# Patient Record
Sex: Male | Born: 1975 | Race: Black or African American | Hispanic: No | Marital: Single | Smoking: Current every day smoker
Health system: Southern US, Community
[De-identification: ages and names within clinical notes are randomized; demographics above are authoritative.]

## PROBLEM LIST (undated history)

## (undated) DIAGNOSIS — I10 Essential (primary) hypertension: Secondary | ICD-10-CM

---

## 2006-04-23 ENCOUNTER — Emergency Department (HOSPITAL_COMMUNITY): Admission: EM | Admit: 2006-04-23 | Discharge: 2006-04-24 | Payer: Self-pay | Admitting: Emergency Medicine

## 2007-08-08 ENCOUNTER — Emergency Department (HOSPITAL_COMMUNITY): Admission: EM | Admit: 2007-08-08 | Discharge: 2007-08-08 | Payer: Self-pay | Admitting: Emergency Medicine

## 2007-08-12 ENCOUNTER — Emergency Department (HOSPITAL_COMMUNITY): Admission: EM | Admit: 2007-08-12 | Discharge: 2007-08-12 | Payer: Self-pay | Admitting: Emergency Medicine

## 2008-01-29 ENCOUNTER — Emergency Department (HOSPITAL_COMMUNITY): Admission: EM | Admit: 2008-01-29 | Discharge: 2008-01-29 | Payer: Self-pay | Admitting: Emergency Medicine

## 2009-01-26 ENCOUNTER — Emergency Department (HOSPITAL_COMMUNITY): Admission: EM | Admit: 2009-01-26 | Discharge: 2009-01-26 | Payer: Self-pay | Admitting: Emergency Medicine

## 2009-04-21 ENCOUNTER — Emergency Department (HOSPITAL_COMMUNITY): Admission: EM | Admit: 2009-04-21 | Discharge: 2009-04-21 | Payer: Self-pay | Admitting: Emergency Medicine

## 2009-04-25 ENCOUNTER — Emergency Department (HOSPITAL_COMMUNITY): Admission: EM | Admit: 2009-04-25 | Discharge: 2009-04-25 | Payer: Self-pay | Admitting: Emergency Medicine

## 2010-09-23 IMAGING — CR DG HAND 3V BILAT
6 series · 6 of 6 positions shown · non-contrast
Comparison: None

CLINICAL DATA: Fell.  Pain.  Lacerations.

BILATERAL HAND - 3 VIEW

[x hand pa left]
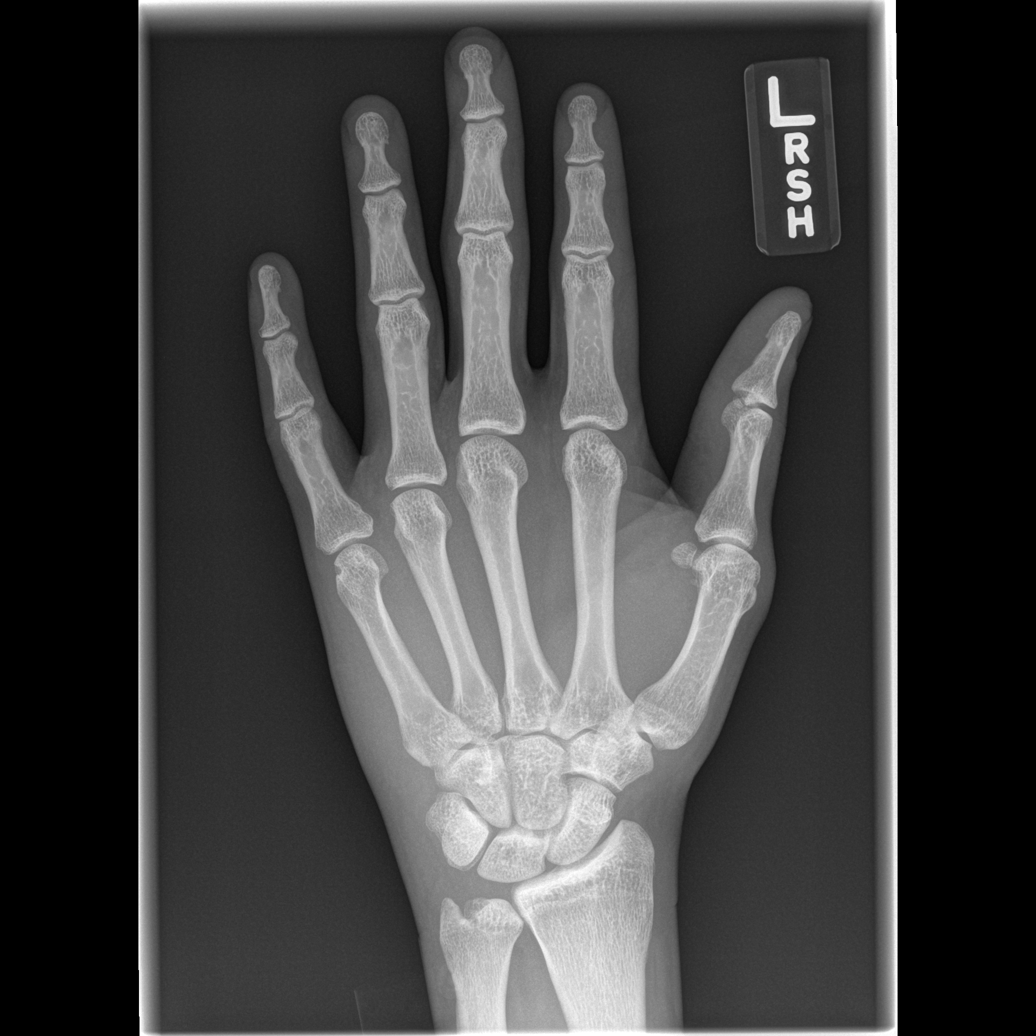

[x hand oblique left]
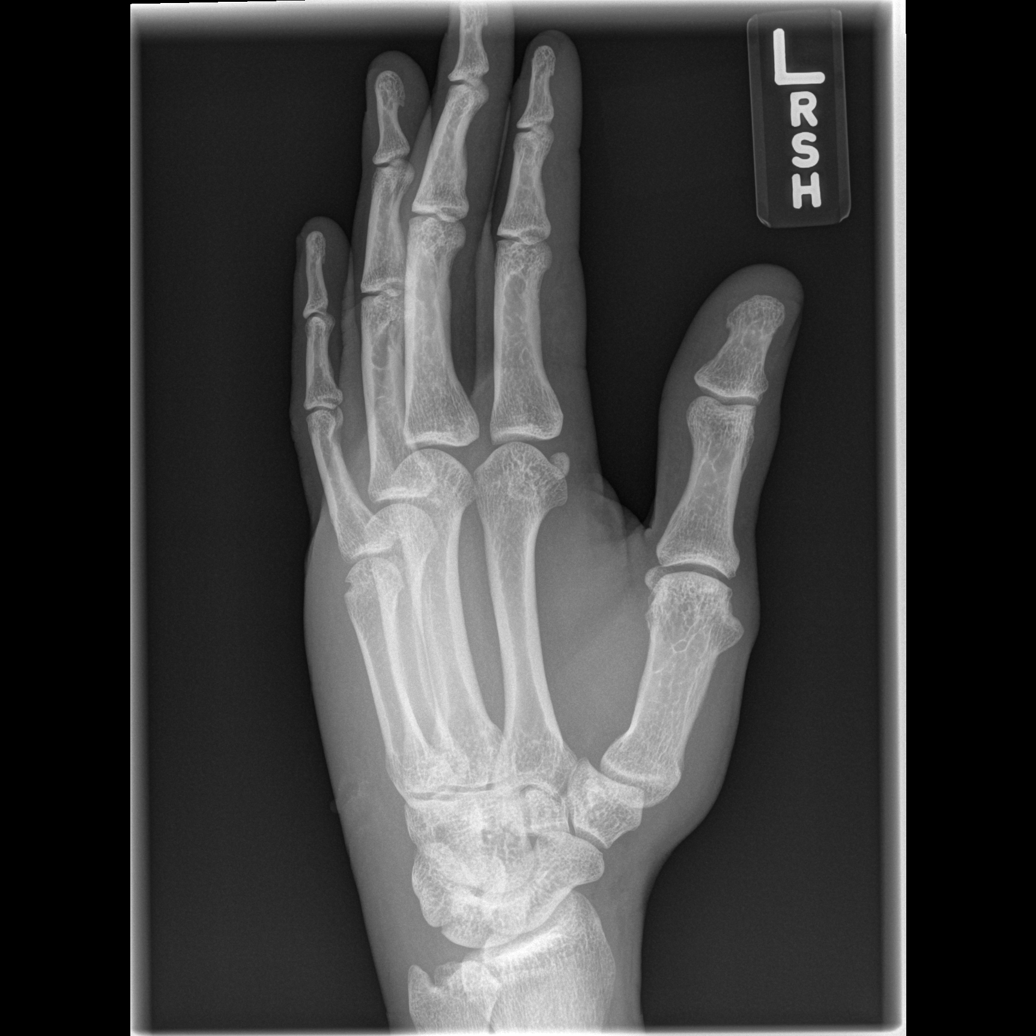

[x hand lat left]
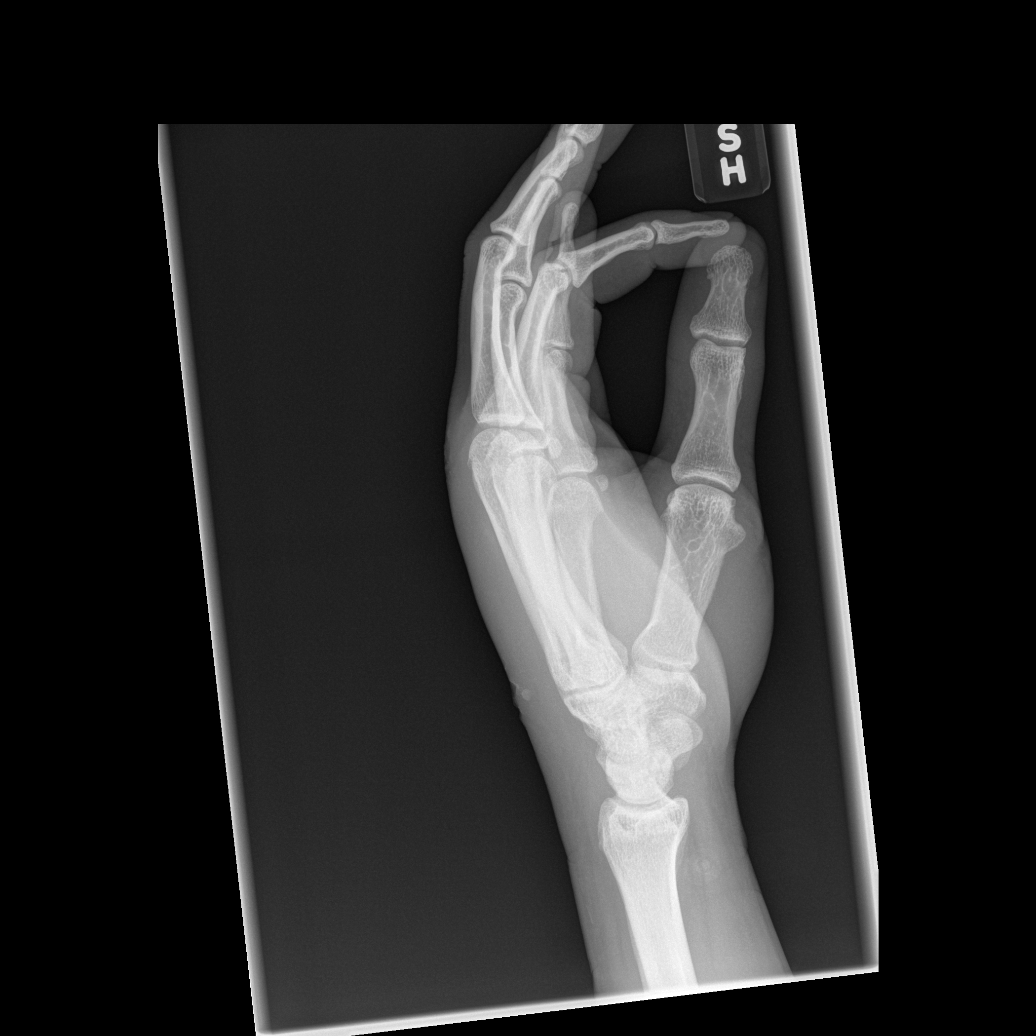

[x hand pa right]
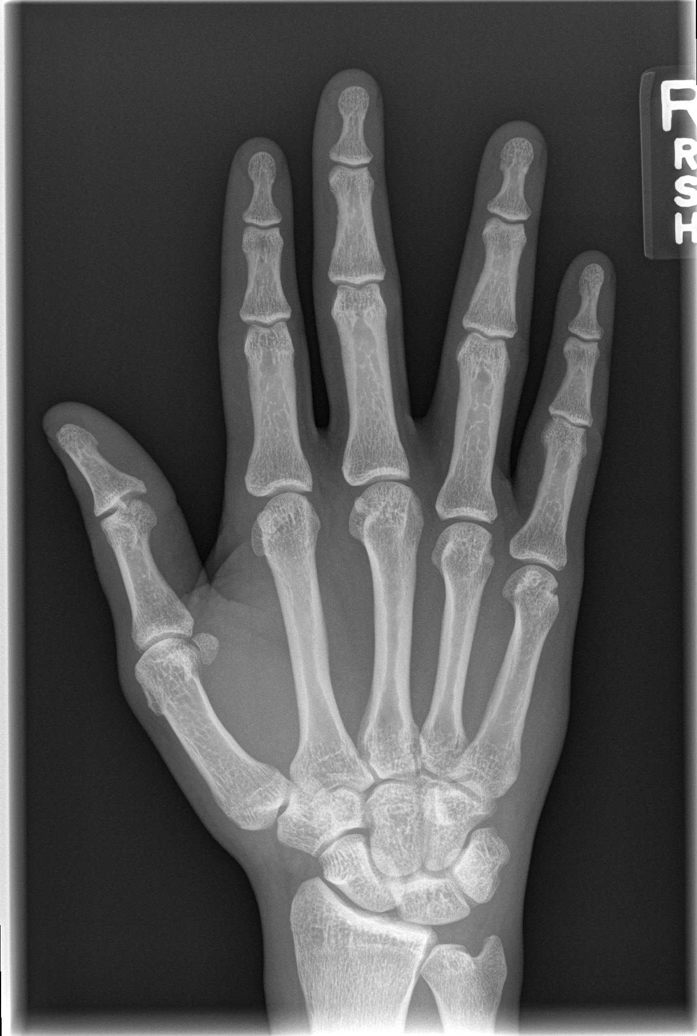

[x hand oblique right]
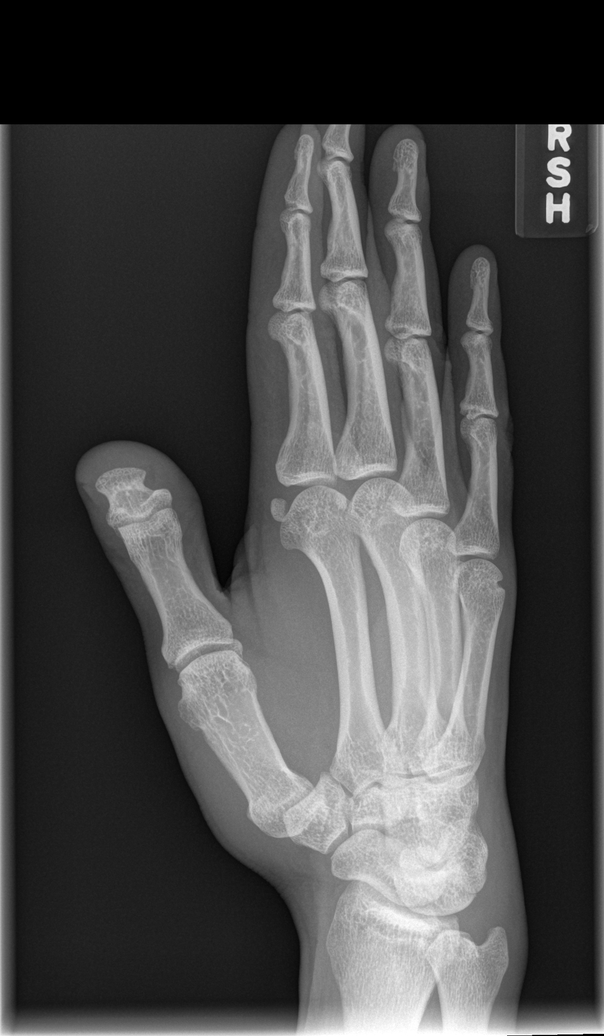

[x hand lat right]
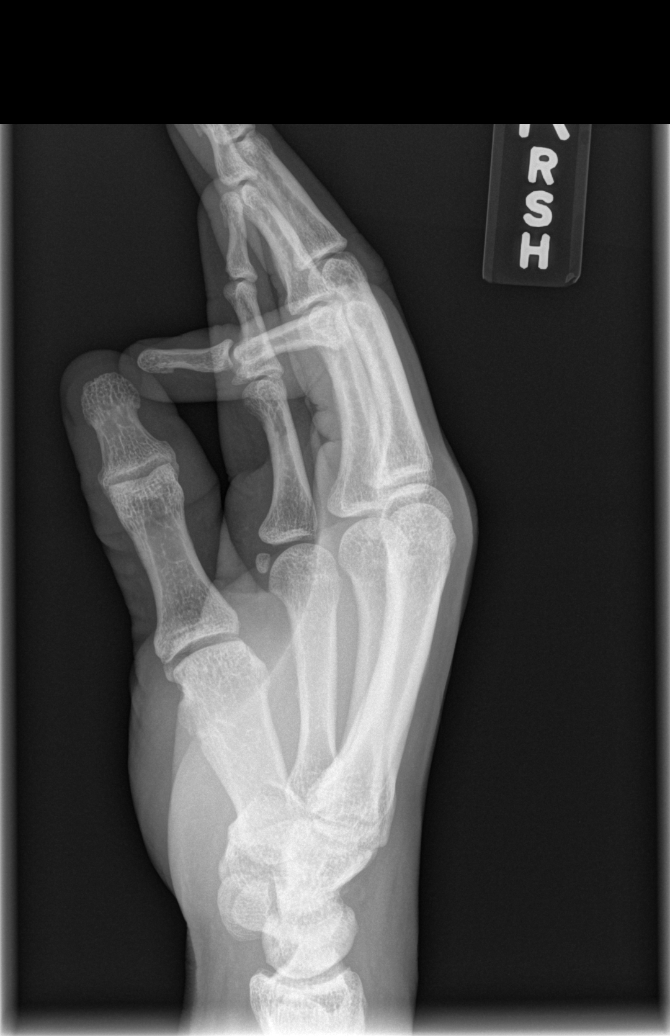

[6 of 6 positions shown; findings below may reference images not displayed]

FINDINGS: Three views of the left hand show a soft tissue
laceration dorsally.  There could be some tiny fleck-like foreign
objects in the region of the laceration.  Elsewhere, the
examination is unremarkable without fracture or dislocation.

On the right, there is no evidence of fracture or dislocation.  No
sign of foreign object.
IMPRESSION: No fracture or dislocation on either side.  Soft tissue laceration
dorsally on the left, possibly with some fleck-like foreign
objects.

## 2011-08-16 LAB — CULTURE, ROUTINE-ABSCESS

## 2020-10-16 ENCOUNTER — Ambulatory Visit (INDEPENDENT_AMBULATORY_CARE_PROVIDER_SITE_OTHER): Payer: Self-pay

## 2020-10-16 ENCOUNTER — Ambulatory Visit (HOSPITAL_COMMUNITY)
Admission: EM | Admit: 2020-10-16 | Discharge: 2020-10-16 | Disposition: A | Discharge status: Home / Self Care | Payer: Self-pay | Attending: Internal Medicine | Admitting: Internal Medicine

## 2020-10-16 ENCOUNTER — Other Ambulatory Visit: Payer: Self-pay

## 2020-10-16 ENCOUNTER — Encounter (HOSPITAL_COMMUNITY): Payer: Self-pay | Admitting: *Deleted

## 2020-10-16 DIAGNOSIS — R2241 Localized swelling, mass and lump, right lower limb: Secondary | ICD-10-CM

## 2020-10-16 DIAGNOSIS — M79674 Pain in right toe(s): Secondary | ICD-10-CM

## 2020-10-16 HISTORY — DX: Essential (primary) hypertension: I10

## 2020-10-16 LAB — URIC ACID: Uric Acid, Serum: 6.9 mg/dL (ref 3.7–8.6)

## 2020-10-16 MED ORDER — PREDNISONE 50 MG PO TABS: ORAL_TABLET | ORAL | 0 refills | Status: AC

## 2020-10-16 NOTE — ED Triage Notes (Signed)
Pt reports right great toe pain x 3 months; believes he either stubbed toe or "bent it too far" while working on a car.  C/O intermittent pain and swelling.  Denies hx of gout.

## 2020-10-16 NOTE — Discharge Instructions (Signed)
Take steroid as prescribed. You need to call the referral number to establish a primary care dr and be seen. You will likely need blood pressure treatment. Please return or go to ER should you develop any fever, chills, chest pain or shortness of breath.

## 2020-10-16 NOTE — ED Provider Notes (Signed)
[facility]    CSN: [id number] Arrival date & time: 10/16/20  1012      History   Chief Complaint Chief Complaint  Patient presents with  . Toe Pain    HPI Matthew Patton is a 44 y.o. male with past medical history of hypertension.  Patient states he was on medication while in jail however states they took him off as his blood pressure was low.  Reports swelling to right great toe and pain x3 months.  Patient states he may have injured toe by "bending it" while working on his car though he is uncertain of specifics.  Patient states swelling is constant though pain only intermittent and "throbbing" in nature.  Patient denies any recent fever or chills, no erythema, no numbness or tingling.  Patient denies any recent headache, dizziness, chest pain, shortness of breath, lower extremity swelling.   Past Medical History:  Diagnosis Date  . Hypertension     There are no problems to display for this patient.   History reviewed. No pertinent surgical history.     Home Medications    Prior to Admission medications   Medication Sig Start Date End Date Taking? Authorizing Provider  predniSONE (DELTASONE) 50 MG tablet One tab once daily 10/16/20   Rolla Etienne, NP    Family History History reviewed. No pertinent family history.  Social History Social History   Tobacco Use  . Smoking status: Current Every Day Smoker  . Smokeless tobacco: Never Used  Vaping Use  . Vaping Use: Some days  Substance Use Topics  . Alcohol use: Yes    Comment: occasionally  . Drug use: Not Currently     Allergies   Patient has no known allergies.   Review of Systems As stated in HPI otherwise negative   Physical Exam Triage Vital Signs ED Triage Vitals  Enc Vitals Group     BP 10/16/20 1051 (!) 160/122     Pulse Rate 10/16/20 1051 76     Resp 10/16/20 1051 16     Temp --      Temp src --      SpO2 10/16/20 1051 98 %     Weight --      Height --      Head  Circumference --      Peak Flow --      Pain Score 10/16/20 1054 7     Pain Loc --      Pain Edu? --      Excl. in GC? --    No data found.  Updated Vital Signs BP (!) 156/112 Comment: states used to be on HTN meds "few yrs ago", but it go D/C'd when in jail  Pulse 76   Resp 16   SpO2 98%       Physical Exam Constitutional:      General: He is not in acute distress.    Appearance: Normal appearance. He is not ill-appearing.  Cardiovascular:     Rate and Rhythm: Normal rate and regular rhythm.  Pulmonary:     Effort: Pulmonary effort is normal.     Breath sounds: Normal breath sounds.  Musculoskeletal:        General: Swelling present. No deformity. Normal range of motion.     Comments: Swelling to right great toe.  No erythema or warmth.  Mild tenderness upon palpation on distal metatarsal.  No obvious deformity.  2+ DP pulse  Skin:    General: Skin  is warm and dry.  Neurological:     Mental Status: He is alert.      UC Treatments / Results  Labs (all labs ordered are listed, but only abnormal results are displayed) Labs Reviewed  URIC ACID  RHEUMATOID FACTOR    EKG   Radiology DG Toe Great Right  Result Date: 10/16/2020 CLINICAL DATA:  Right great toe pain and swelling. EXAM: RIGHT GREAT TOE COMPARISON:  None. FINDINGS: There is no evidence of fracture or dislocation. There is no evidence of arthropathy or other focal bone abnormality. Mild diffuse soft tissue swelling is noted concerning for edema or inflammation. IMPRESSION: No fracture or dislocation is noted. Mild diffuse soft tissue swelling is noted concerning for edema or inflammation. Electronically Signed   By: Lupita Raider M.D.   On: 10/16/2020 11:56    Procedures Procedures (including critical care time)  Medications Ordered in UC Medications - No data to display  Initial Impression / Assessment and Plan / UC Course  I have reviewed the triage vital signs and the nursing notes.  Pertinent  labs & imaging results that were available during my care of the patient were reviewed by me and considered in my medical decision making (see chart for details).  Great toe pain, right -Unclear etiology.  X-ray negative for fracture.  Consider sprain with questionable injury months ago -No evidence of infection and not especially tender to suggest gouty flare -Check uric acid, rheumatoid factor -Short steroid burst to determine if this alleviates symptoms  Hypertension -Patient states he was on medication while in jail recently though stopped because his BP became too low. -Denies any headache, dizziness, chest pain, shortness of breath -Portance of follow-up explained to patient with referrals given -Patient verifies understanding  Reviewed expections re: course of current medical issues. Questions answered. Outlined signs and symptoms indicating need for more acute intervention. Pt verbalized understanding. AVS given   Final Clinical Impressions(s) / UC Diagnoses   Final diagnoses:  Pain of right great toe     Discharge Instructions     Take steroid as prescribed. You need to call the referral number to establish a primary care dr and be seen. You will likely need blood pressure treatment. Please return or go to ER should you develop any fever, chills, chest pain or shortness of breath.     ED Prescriptions    Medication Sig Dispense Auth. Provider   predniSONE (DELTASONE) 50 MG tablet One tab once daily 7 tablet Rolla Etienne, NP     PDMP not reviewed this encounter.   Rolla Etienne, NP 10/16/20 1659

## 2020-10-18 LAB — RHEUMATOID FACTOR: Rhuematoid fact SerPl-aCnc: <10 IU/mL (ref <14.0)

## 2020-11-15 ENCOUNTER — Inpatient Hospital Stay (INDEPENDENT_AMBULATORY_CARE_PROVIDER_SITE_OTHER): Payer: Self-pay | Admitting: Primary Care

## 2021-01-09 ENCOUNTER — Ambulatory Visit: Payer: Self-pay | Admitting: Nurse Practitioner

## 2022-03-20 IMAGING — DX DG TOE GREAT 2+V*R*
3 series · 3 of 3 positions shown · non-contrast
Comparison: None.

CLINICAL DATA: Right great toe pain and swelling.

EXAM:
RIGHT GREAT TOE

[toe ap]
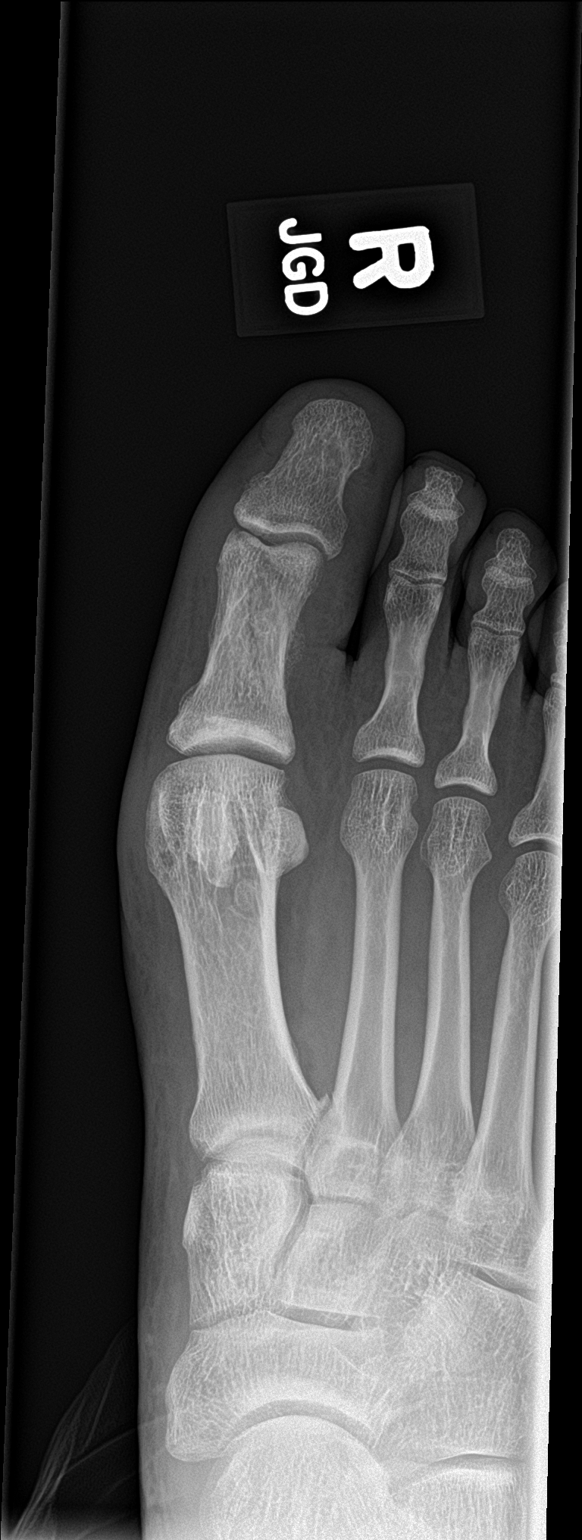

[toe obl]
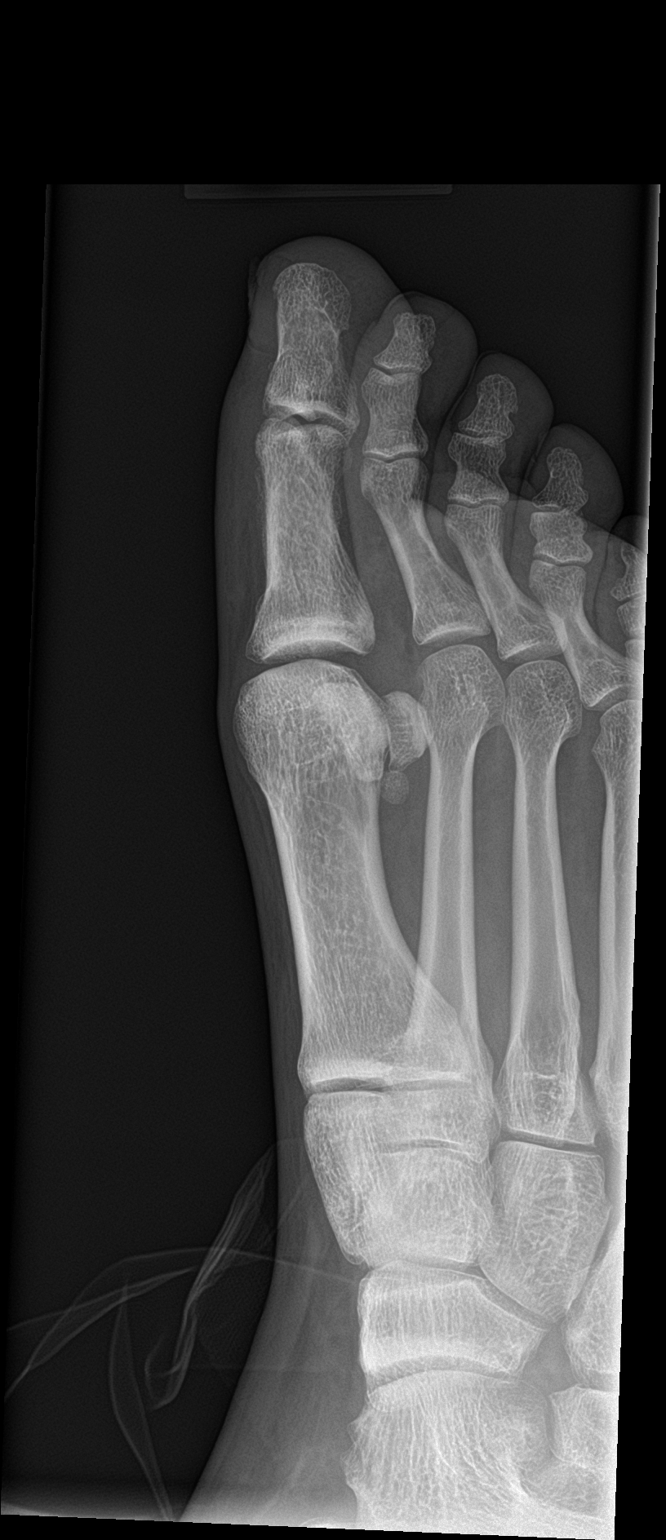

[toe lat]
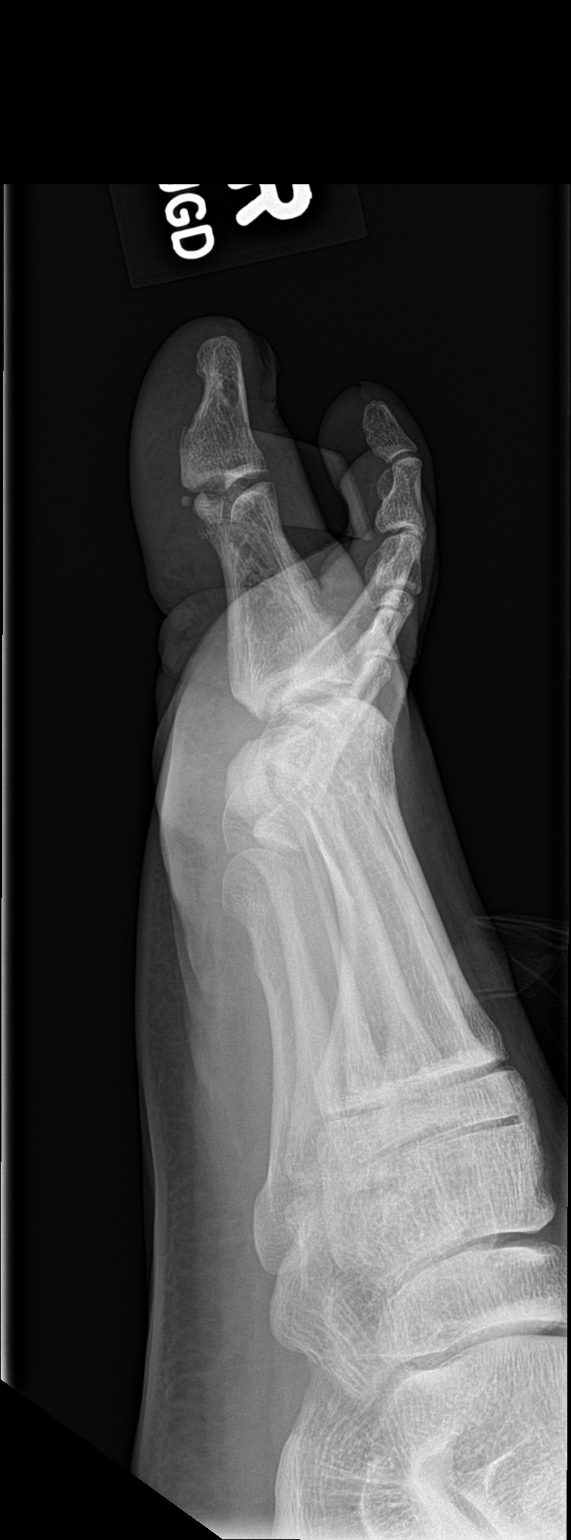

[3 of 3 positions shown; findings below may reference images not displayed]

FINDINGS: There is no evidence of fracture or dislocation. There is no
evidence of arthropathy or other focal bone abnormality. Mild
diffuse soft tissue swelling is noted concerning for edema or
inflammation.
IMPRESSION: No fracture or dislocation is noted. Mild diffuse soft tissue
swelling is noted concerning for edema or inflammation.

## 2024-03-06 ENCOUNTER — Other Ambulatory Visit: Payer: Self-pay

## 2024-03-06 ENCOUNTER — Emergency Department (HOSPITAL_COMMUNITY): Payer: Self-pay

## 2024-03-06 ENCOUNTER — Encounter (HOSPITAL_COMMUNITY): Payer: Self-pay

## 2024-03-06 ENCOUNTER — Emergency Department (HOSPITAL_COMMUNITY)
Admission: EM | Admit: 2024-03-06 | Discharge: 2024-03-06 | Disposition: A | Discharge status: Home / Self Care | Payer: Self-pay | Attending: Emergency Medicine | Admitting: Emergency Medicine

## 2024-03-06 DIAGNOSIS — R55 Syncope and collapse: Secondary | ICD-10-CM | POA: Diagnosis present

## 2024-03-06 DIAGNOSIS — I10 Essential (primary) hypertension: Secondary | ICD-10-CM | POA: Insufficient documentation

## 2024-03-06 DIAGNOSIS — E876 Hypokalemia: Secondary | ICD-10-CM | POA: Diagnosis not present

## 2024-03-06 LAB — CBC WITH DIFFERENTIAL/PLATELET
Abs Immature Granulocytes: 0.04 10*3/uL (ref 0.00–0.07)
Basophils Absolute: 0 10*3/uL (ref 0.0–0.1)
Basophils Relative: 1 %
Eosinophils Absolute: 0.1 10*3/uL (ref 0.0–0.5)
Eosinophils Relative: 1 %
HCT: 40.2 % (ref 39.0–52.0)
Hemoglobin: 13.3 g/dL (ref 13.0–17.0)
Immature Granulocytes: 1 %
Lymphocytes Relative: 29 %
Lymphs Abs: 1.6 10*3/uL (ref 0.7–4.0)
MCH: 28.5 pg (ref 26.0–34.0)
MCHC: 33.1 g/dL (ref 30.0–36.0)
MCV: 86.1 fL (ref 80.0–100.0)
Monocytes Absolute: 0.6 10*3/uL (ref 0.1–1.0)
Monocytes Relative: 10 %
Neutro Abs: 3.3 10*3/uL (ref 1.7–7.7)
Neutrophils Relative %: 58 %
Platelets: 273 10*3/uL (ref 150–400)
RBC: 4.67 MIL/uL (ref 4.22–5.81)
RDW: 13.5 % (ref 11.5–15.5)
WBC: 5.6 10*3/uL (ref 4.0–10.5)
nRBC: 0 % (ref 0.0–0.2)

## 2024-03-06 LAB — COMPREHENSIVE METABOLIC PANEL WITH GFR
ALT: 65 U/L — ABNORMAL HIGH (ref 0–44)
AST: 36 U/L (ref 15–41)
Albumin: 3.3 g/dL — ABNORMAL LOW (ref 3.5–5.0)
Alkaline Phosphatase: 41 U/L (ref 38–126)
Anion gap: 11 (ref 5–15)
BUN: 11 mg/dL (ref 6–20)
CO2: 25 mmol/L (ref 22–32)
Calcium: 8.9 mg/dL (ref 8.9–10.3)
Chloride: 104 mmol/L (ref 98–111)
Creatinine, Ser: 1.64 mg/dL — ABNORMAL HIGH (ref 0.61–1.24)
GFR, Estimated: 52 mL/min — ABNORMAL LOW (ref >60)
Glucose, Bld: 139 mg/dL — ABNORMAL HIGH (ref 70–99)
Potassium: 3 mmol/L — ABNORMAL LOW (ref 3.5–5.1)
Sodium: 140 mmol/L (ref 135–145)
Total Bilirubin: 0.9 mg/dL (ref 0.0–1.2)
Total Protein: 6.5 g/dL (ref 6.5–8.1)

## 2024-03-06 LAB — URINALYSIS, W/ REFLEX TO CULTURE (INFECTION SUSPECTED)
Bilirubin Urine: NEGATIVE
Glucose, UA: NEGATIVE mg/dL
Ketones, ur: NEGATIVE mg/dL
Leukocytes,Ua: NEGATIVE
Nitrite: NEGATIVE
Protein, ur: NEGATIVE mg/dL
Specific Gravity, Urine: 1.006 (ref 1.005–1.030)
pH: 5 (ref 5.0–8.0)

## 2024-03-06 LAB — I-STAT CHEM 8, ED
BUN: 13 mg/dL (ref 6–20)
Calcium, Ion: 1.13 mmol/L — ABNORMAL LOW (ref 1.15–1.40)
Chloride: 103 mmol/L (ref 98–111)
Creatinine, Ser: 1.6 mg/dL — ABNORMAL HIGH (ref 0.61–1.24)
Glucose, Bld: 136 mg/dL — ABNORMAL HIGH (ref 70–99)
HCT: 41 % (ref 39.0–52.0)
Hemoglobin: 13.9 g/dL (ref 13.0–17.0)
Potassium: 2.9 mmol/L — ABNORMAL LOW (ref 3.5–5.1)
Sodium: 141 mmol/L (ref 135–145)
TCO2: 25 mmol/L (ref 22–32)

## 2024-03-06 LAB — RAPID URINE DRUG SCREEN, HOSP PERFORMED
Amphetamines: NOT DETECTED
Barbiturates: NOT DETECTED
Benzodiazepines: NOT DETECTED
Cocaine: POSITIVE — AB
Opiates: NOT DETECTED
Tetrahydrocannabinol: POSITIVE — AB

## 2024-03-06 LAB — I-STAT CG4 LACTIC ACID, ED
Lactic Acid, Venous: 2.2 mmol/L (ref 0.5–1.9)
Lactic Acid, Venous: 1.5 mmol/L (ref 0.5–1.9)

## 2024-03-06 LAB — ETHANOL: Alcohol, Ethyl (B): <15 mg/dL (ref <15)

## 2024-03-06 LAB — TROPONIN I (HIGH SENSITIVITY)
Troponin I (High Sensitivity): 6 ng/L (ref <18)
Troponin I (High Sensitivity): 5 ng/L (ref <18)

## 2024-03-06 LAB — CK: Total CK: 104 U/L (ref 49–397)

## 2024-03-06 MED ORDER — POTASSIUM CHLORIDE CRYS ER 20 MEQ PO TBCR
40.0000 meq | EXTENDED_RELEASE_TABLET | Freq: Once | ORAL | Status: AC
  Administered 2024-03-06: 40 meq via ORAL
  Filled 2024-03-06: qty 2

## 2024-03-06 MED ORDER — POTASSIUM CHLORIDE CRYS ER 20 MEQ PO TBCR: 20.0000 meq | EXTENDED_RELEASE_TABLET | Freq: Two times a day (BID) | ORAL | 0 refills | Status: AC

## 2024-03-06 NOTE — ED Triage Notes (Signed)
 Pt BIB EMS with c/o sudden onset near syncope and leg/hands cramping that started today. Per EMS, pt was hypotensive and diaphoretic on their arrival at 70/40 and pt received 1200cc of NS. Pt has recently restarted using cocaine/marijuana and alcohol after being clean for a few years. Pt drinks 2-3 beers a day. Pt endorses SOB, dizziness, and nausea. Pt denies cough, congestion, or fevers.    80/58 HR 80 CBG 137 99% 2L  16G L Arm

## 2024-03-06 NOTE — ED Provider Notes (Signed)
  EMERGENCY DEPARTMENT AT [facility] Provider Note   CSN: [id number] Arrival date & time: 03/06/24  1539     History  Chief Complaint  Patient presents with   Hypotension    Matthew Patton is a 48 y.o. male.  Patient is a 48 year old male with a history of hypertension, alcohol use disorder, polysubstance abuse who presents with a near syncopal type episode.  He states this afternoon he started getting lightheaded and his arms and legs were cramping.  He got nauseated and diaphoretic.  He called EMS.  He felt like he was going to pass out.  He also got short of breath.  On EMS arrival, he was diaphoretic and hypotensive.  He was initially a bit bradycardic but his heart rate improved as well as his blood pressure with IV fluids.  It came up to the upper 80s after 1200 cc of fluid although on arrival it was 105/77.  He had a little abdominal cramping while this was happening but he denies any abdominal pain currently.  No associated chest pain.  No recent illnesses.  No cough or cold symptoms.  No vomiting or diarrhea.  He states that he had stopped drinking a while ago but started back again in March.  He typically drinks 2-3 beers a day.  He also uses marijuana and cocaine which he did in the early hours in the morning.       Home Medications Prior to Admission medications   Medication Sig Start Date End Date Taking? Authorizing Provider  potassium chloride  SA (KLOR-CON  M) 20 MEQ tablet Take 1 tablet (20 mEq total) by mouth 2 (two) times daily. 03/06/24  Yes Hershel Los, MD  predniSONE  (DELTASONE ) 50 MG tablet One tab once daily 10/16/20   Smith, Laura E, NP      Allergies    Patient has no known allergies.    Review of Systems   Review of Systems  Constitutional:  Positive for diaphoresis and fatigue. Negative for chills and fever.  HENT:  Negative for congestion, rhinorrhea and sneezing.   Eyes: Negative.   Respiratory:  Positive for shortness of breath.  Negative for cough and chest tightness.   Cardiovascular:  Negative for chest pain and leg swelling.  Gastrointestinal:  Positive for nausea. Negative for abdominal pain, blood in stool, diarrhea and vomiting.  Genitourinary:  Negative for difficulty urinating, flank pain, frequency and hematuria.  Musculoskeletal:  Negative for arthralgias and back pain.  Skin:  Negative for rash.  Neurological:  Positive for light-headedness. Negative for dizziness, speech difficulty, weakness, numbness and headaches.    Physical Exam Updated Vital Signs BP (!) 100/58   Pulse 82   Temp 98.4 F (36.9 C) (Oral)   Resp (!) 23   Ht 5\' 1"  (1.549 m)   Wt 108.9 kg   SpO2 100%   BMI 45.35 kg/m  Physical Exam Constitutional:      Appearance: He is well-developed.  HENT:     Head: Normocephalic and atraumatic.  Eyes:     Pupils: Pupils are equal, round, and reactive to light.  Cardiovascular:     Rate and Rhythm: Normal rate and regular rhythm.     Heart sounds: Normal heart sounds.  Pulmonary:     Effort: Pulmonary effort is normal. No respiratory distress.     Breath sounds: Normal breath sounds. No wheezing or rales.  Chest:     Chest wall: No tenderness.  Abdominal:     General: Bowel  sounds are normal.     Palpations: Abdomen is soft.     Tenderness: There is no abdominal tenderness. There is no guarding or rebound.  Musculoskeletal:        General: Normal range of motion.     Cervical back: Normal range of motion and neck supple.  Lymphadenopathy:     Cervical: No cervical adenopathy.  Skin:    General: Skin is warm and dry.     Findings: No rash.  Neurological:     General: No focal deficit present.     Mental Status: He is alert and oriented to person, place, and time.     ED Results / Procedures / Treatments   Labs (all labs ordered are listed, but only abnormal results are displayed) Labs Reviewed  COMPREHENSIVE METABOLIC PANEL WITH GFR - Abnormal; Notable for the following  components:      Result Value   Potassium 3.0 (*)    Glucose, Bld 139 (*)    Creatinine, Ser 1.64 (*)    Albumin 3.3 (*)    ALT 65 (*)    GFR, Estimated 52 (*)    All other components within normal limits  URINALYSIS, W/ REFLEX TO CULTURE (INFECTION SUSPECTED) - Abnormal; Notable for the following components:   APPearance HAZY (*)    Hgb urine dipstick SMALL (*)    Bacteria, UA RARE (*)    All other components within normal limits  RAPID URINE DRUG SCREEN, HOSP PERFORMED - Abnormal; Notable for the following components:   Cocaine POSITIVE (*)    Tetrahydrocannabinol POSITIVE (*)    All other components within normal limits  I-STAT CHEM 8, ED - Abnormal; Notable for the following components:   Potassium 2.9 (*)    Creatinine, Ser 1.60 (*)    Glucose, Bld 136 (*)    Calcium, Ion 1.13 (*)    All other components within normal limits  I-STAT CG4 LACTIC ACID, ED - Abnormal; Notable for the following components:   Lactic Acid, Venous 2.2 (*)    All other components within normal limits  CBC WITH DIFFERENTIAL/PLATELET  ETHANOL  CK  I-STAT CG4 LACTIC ACID, ED  TROPONIN I (HIGH SENSITIVITY)  TROPONIN I (HIGH SENSITIVITY)    EKG EKG Interpretation Date/Time:  Friday Mar 06 2024 15:51:41 EDT Ventricular Rate:  87 PR Interval:  173 QRS Duration:  105 QT Interval:  375 QTC Calculation: 452 R Axis:   44  Text Interpretation: Sinus rhythm Abnormal R-wave progression, early transition ST elevation, consider inferior injury No old tracing to compare Confirmed by Hershel Los [phone]) on 03/06/2024 4:15:02 PM  Radiology DG Chest Port 1 View Result Date: 03/06/2024 CLINICAL DATA:  SOB EXAM: PORTABLE CHEST - 1 VIEW COMPARISON:  None available. FINDINGS: No focal airspace consolidation, pleural effusion, or pneumothorax. No cardiomegaly. No acute fracture or destructive lesion. Multilevel thoracic osteophytosis. IMPRESSION: No acute cardiopulmonary abnormality. Electronically Signed   By:  Rance Burrows M.D.   On: 03/06/2024 16:41    Procedures Procedures    Medications Ordered in ED Medications  potassium chloride  SA (KLOR-CON  M) CR tablet 40 mEq (40 mEq Oral Given 03/06/24 1942)    ED Course/ Medical Decision Making/ A&P                                 Medical Decision Making Amount and/or Complexity of Data Reviewed Labs: ordered. Radiology: ordered.  Risk Prescription drug management.  Patient is a 48 year old who presents after a near syncopal event associated with hypotension and bradycardia.  His vital signs had improved on arrival to the ED and he was feeling better.  He does report marijuana and cocaine use this morning.  His labs show evidence of a low potassium but otherwise nonconcerning.  He has had 2 negative troponins.  No ischemic changes or arrhythmias noted on his EKG or cardiac monitoring.  No significant anemia.  His labs do show a mild elevation in his creatinine but I do not have any blood work on file to compare to so I do not know what his baseline level is.  I did check a CK which does not show any evidence of rhabdomyolysis.  Chest x-ray 1-view was interpreted by me and confirmed by the radiologist to show no acute abnormality.  He does not have any symptoms that would be more concerning for stroke or ACS.  His lactate was initially elevated but that normalized after IV fluids.  He does not have any other concerns for infection/sepsis.  On repeat exam, he did complain of a little bit of abdominal discomfort.  Also his blood pressure dropped back down into the 90s but on recheck it was 100/58.  Discussed with him staying for another liter of fluid and I would like to do a CTA of his abdomen to make sure he did not have any other concerns that would have led to this such as aortic aneurysm/dissection or infectious process.  Patient does not want to stay any longer.  He says he has to get home.  I did advise him that this potentially could be  life-threatening and he says that he will come back to get it done at a later time.  He is given a prescription for potassium for 3 days.  Return precautions were given.  I did encourage him to establish care with a primary care doctor to recheck his labs.  Final Clinical Impression(s) / ED Diagnoses Final diagnoses:  Near syncope  Hypokalemia    Rx / DC Orders ED Discharge Orders          Ordered    potassium chloride  SA (KLOR-CON  M) 20 MEQ tablet  2 times daily        03/06/24 1956              Hershel Los, MD 03/06/24 2003

## 2024-07-08 ENCOUNTER — Encounter (HOSPITAL_COMMUNITY): Payer: Self-pay | Admitting: *Deleted

## 2024-07-08 ENCOUNTER — Other Ambulatory Visit: Payer: Self-pay

## 2024-07-08 ENCOUNTER — Emergency Department (HOSPITAL_COMMUNITY)
Admission: EM | Admit: 2024-07-08 | Discharge: 2024-07-09 | Disposition: A | Discharge status: Home / Self Care | Attending: Emergency Medicine | Admitting: Emergency Medicine

## 2024-07-08 DIAGNOSIS — S0990XA Unspecified injury of head, initial encounter: Secondary | ICD-10-CM | POA: Diagnosis present

## 2024-07-08 DIAGNOSIS — H5712 Ocular pain, left eye: Secondary | ICD-10-CM | POA: Diagnosis not present

## 2024-07-08 DIAGNOSIS — S0081XA Abrasion of other part of head, initial encounter: Secondary | ICD-10-CM | POA: Diagnosis not present

## 2024-07-08 DIAGNOSIS — R079 Chest pain, unspecified: Secondary | ICD-10-CM | POA: Insufficient documentation

## 2024-07-08 MED ORDER — OXYCODONE HCL 5 MG PO TABS
10.0000 mg | ORAL_TABLET | Freq: Once | ORAL | Status: AC
  Administered 2024-07-09: 10 mg via ORAL
  Filled 2024-07-08: qty 2

## 2024-07-08 MED ORDER — TETRACAINE HCL 0.5 % OP SOLN
1.0000 [drp] | Freq: Once | OPHTHALMIC | Status: AC
  Administered 2024-07-09: 1 [drp] via OPHTHALMIC
  Filled 2024-07-08: qty 4
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
## 2024-07-09 ENCOUNTER — Emergency Department (HOSPITAL_COMMUNITY)

## 2024-07-09 ENCOUNTER — Encounter (HOSPITAL_COMMUNITY): Payer: Self-pay | Admitting: Radiology

## 2024-07-09 MED ORDER — ACETAMINOPHEN 500 MG PO TABS
1000.0000 mg | ORAL_TABLET | Freq: Once | ORAL | Status: AC
  Administered 2024-07-09: 1000 mg via ORAL
  Filled 2024-07-09: qty 2

## 2024-07-09 MED ORDER — OXYCODONE HCL 5 MG PO TABS
5.0000 mg | ORAL_TABLET | Freq: Once | ORAL | Status: AC
  Administered 2024-07-09: 5 mg via ORAL
  Filled 2024-07-09: qty 1

## 2024-07-09 NOTE — Discharge Instructions (Addendum)
 Two left upper lobe nodules, the larger measuring 4 mm. No  follow-up needed if patient is low-risk (and has no known or  suspected primary neoplasm). Non-contrast chest CT can be considered  in 12 months if patient is high-risk. This recommendation follows  the consensus statement: Guidelines for Management of Incidental  Pulmonary Nodules Detected on CT Images: From the [employer]

## 2024-07-12 ENCOUNTER — Encounter (HOSPITAL_COMMUNITY): Payer: Self-pay

## 2024-07-12 ENCOUNTER — Emergency Department (HOSPITAL_COMMUNITY)
Admission: EM | Admit: 2024-07-12 | Discharge: 2024-07-12 | Disposition: A | Discharge status: Home / Self Care | Attending: Emergency Medicine | Admitting: Emergency Medicine

## 2024-07-12 ENCOUNTER — Emergency Department (HOSPITAL_COMMUNITY)

## 2024-07-12 ENCOUNTER — Other Ambulatory Visit: Payer: Self-pay

## 2024-07-12 DIAGNOSIS — F1910 Other psychoactive substance abuse, uncomplicated: Secondary | ICD-10-CM | POA: Insufficient documentation

## 2024-07-12 DIAGNOSIS — R079 Chest pain, unspecified: Secondary | ICD-10-CM | POA: Insufficient documentation

## 2024-07-12 DIAGNOSIS — H11421 Conjunctival edema, right eye: Secondary | ICD-10-CM | POA: Insufficient documentation

## 2024-07-12 DIAGNOSIS — Z5900 Homelessness unspecified: Secondary | ICD-10-CM | POA: Insufficient documentation

## 2024-07-12 DIAGNOSIS — I10 Essential (primary) hypertension: Secondary | ICD-10-CM | POA: Diagnosis not present

## 2024-07-12 DIAGNOSIS — S0591XD Unspecified injury of right eye and orbit, subsequent encounter: Secondary | ICD-10-CM | POA: Diagnosis not present

## 2024-07-12 DIAGNOSIS — S0591XS Unspecified injury of right eye and orbit, sequela: Secondary | ICD-10-CM

## 2024-07-12 DIAGNOSIS — H5711 Ocular pain, right eye: Secondary | ICD-10-CM | POA: Diagnosis present

## 2024-07-12 LAB — CBC
HCT: 41.5 % (ref 39.0–52.0)
Hemoglobin: 13.5 g/dL (ref 13.0–17.0)
MCH: 28.1 pg (ref 26.0–34.0)
MCHC: 32.5 g/dL (ref 30.0–36.0)
MCV: 86.3 fL (ref 80.0–100.0)
Platelets: 237 K/uL (ref 150–400)
RBC: 4.81 MIL/uL (ref 4.22–5.81)
RDW: 12.6 % (ref 11.5–15.5)
WBC: 5.3 K/uL (ref 4.0–10.5)
nRBC: 0 % (ref 0.0–0.2)

## 2024-07-12 LAB — BASIC METABOLIC PANEL WITH GFR
Anion gap: 15 (ref 5–15)
BUN: 9 mg/dL (ref 6–20)
CO2: 22 mmol/L (ref 22–32)
Calcium: 9.6 mg/dL (ref 8.9–10.3)
Chloride: 100 mmol/L (ref 98–111)
Creatinine, Ser: 0.91 mg/dL (ref 0.61–1.24)
GFR, Estimated: >60 mL/min (ref >60)
Glucose, Bld: 89 mg/dL (ref 70–99)
Potassium: 3.9 mmol/L (ref 3.5–5.1)
Sodium: 137 mmol/L (ref 135–145)

## 2024-07-12 LAB — TROPONIN T, HIGH SENSITIVITY: Troponin T High Sensitivity: <15 ng/L (ref 0–19)

## 2024-07-12 MED ORDER — NEOMYCIN-POLYMYXIN-DEXAMETH 3.5-10000-0.1 OP OINT
TOPICAL_OINTMENT | Freq: Once | OPHTHALMIC | Status: AC
  Administered 2024-07-12: 1 via OPHTHALMIC
  Filled 2024-07-12: qty 3.5

## 2024-07-12 MED ORDER — FAMOTIDINE 20 MG PO TABS
20.0000 mg | ORAL_TABLET | Freq: Once | ORAL | Status: AC
  Administered 2024-07-12: 20 mg via ORAL
  Filled 2024-07-12: qty 1

## 2024-07-12 MED ORDER — DORZOLAMIDE HCL-TIMOLOL MAL 2-0.5 % OP SOLN
1.0000 [drp] | Freq: Once | OPHTHALMIC | Status: AC
  Administered 2024-07-12: 1 [drp] via OPHTHALMIC
  Filled 2024-07-12: qty 10

## 2024-07-12 MED ORDER — TETRACAINE HCL 0.5 % OP SOLN
2.0000 [drp] | Freq: Once | OPHTHALMIC | Status: AC
  Administered 2024-07-12: 2 [drp] via OPHTHALMIC
  Filled 2024-07-12: qty 4

## 2024-07-12 MED ORDER — FLUORESCEIN SODIUM 1 MG OP STRP
1.0000 | ORAL_STRIP | Freq: Once | OPHTHALMIC | Status: AC
  Administered 2024-07-12: 1 via OPHTHALMIC
  Filled 2024-07-12: qty 1

## 2024-07-12 MED ORDER — LIDOCAINE VISCOUS HCL 2 % MT SOLN
15.0000 mL | Freq: Once | OROMUCOSAL | Status: AC
  Administered 2024-07-12: 15 mL via ORAL
  Filled 2024-07-12: qty 15

## 2024-07-12 MED ORDER — ALUM & MAG HYDROXIDE-SIMETH 200-200-20 MG/5ML PO SUSP
30.0000 mL | Freq: Once | ORAL | Status: AC
  Administered 2024-07-12: 30 mL via ORAL
  Filled 2024-07-12: qty 30

## 2024-07-12 NOTE — Discharge Instructions (Addendum)
 It was a pleasure taking care of you today.  Based on your history, physical exam, labs, and imaging I feel you are safe for discharge.  You have been given 2 medications in the emergency department.  Please use the timolol  eyedrops 2 times daily, and use the Maxitrol  (neomycin , polymyxin, b-dexamethasone ) ointment 4 times daily (every 6 hours). You have been given an appointment with Dr. Maree who is an eye doctor at 8am tomorrow. His office information is attached, please call and make sure you tell them you were seen in the emergency department and were told to be seen at 8am tomorrow.  Your chest pain workup was reassuring.  If you experience any of the following symptoms including but not limited to worsening eye swelling, worsening eyesight, severe eye pain, sudden vision loss, worsening chest pain, fever, chills, or other concerning symptom please return to the emergency department.  You have also been given attached resources for alcohol and drug rehab in the area.  Please get in contact with 1 of these as soon as possible.
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
## 2024-07-12 NOTE — ED Notes (Addendum)
 I was unable to do the visual screening as pt stated that his head/eye would hurt when he would try focusing on reading the numbers

## 2024-07-12 NOTE — ED Notes (Signed)
 Given patient beef sandwich and cup of water

## 2024-07-12 NOTE — ED Notes (Signed)
 Patient transported to XR.
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
